# Patient Record
Sex: Female | Born: 1970 | Race: Black or African American | Hispanic: Yes | Marital: Single | Smoking: Never smoker
Health system: Southern US, Community
[De-identification: ages and names within clinical notes are randomized; demographics above are authoritative.]

## PROBLEM LIST (undated history)

## (undated) DIAGNOSIS — I10 Essential (primary) hypertension: Secondary | ICD-10-CM

## (undated) HISTORY — PX: ABDOMINAL HYSTERECTOMY: SHX81

---

## 2016-07-27 ENCOUNTER — Emergency Department
Admission: EM | Admit: 2016-07-27 | Discharge: 2016-07-28 | Disposition: A | Discharge status: Home / Self Care | Payer: Managed Care, Other (non HMO) | Attending: Emergency Medicine | Admitting: Emergency Medicine

## 2016-07-27 DIAGNOSIS — R51 Headache: Secondary | ICD-10-CM | POA: Diagnosis present

## 2016-07-27 DIAGNOSIS — I10 Essential (primary) hypertension: Secondary | ICD-10-CM | POA: Diagnosis not present

## 2016-07-27 DIAGNOSIS — R519 Headache, unspecified: Secondary | ICD-10-CM

## 2016-07-27 DIAGNOSIS — Z79899 Other long term (current) drug therapy: Secondary | ICD-10-CM | POA: Diagnosis not present

## 2016-07-27 HISTORY — DX: Essential (primary) hypertension: I10

## 2016-07-27 MED ORDER — KETOROLAC TROMETHAMINE 30 MG/ML IJ SOLN
30.0000 mg | Freq: Once | INTRAMUSCULAR | Status: AC
  Administered 2016-07-28: 30 mg via INTRAVENOUS
  Filled 2016-07-27: qty 1

## 2016-07-27 MED ORDER — METOCLOPRAMIDE HCL 5 MG/ML IJ SOLN
10.0000 mg | Freq: Once | INTRAMUSCULAR | Status: AC
Start: 2016-07-28 — End: 2016-07-28
  Administered 2016-07-28: 10 mg via INTRAVENOUS
  Filled 2016-07-27: qty 2

## 2016-07-27 MED ORDER — DIPHENHYDRAMINE HCL 50 MG/ML IJ SOLN
25.0000 mg | Freq: Once | INTRAMUSCULAR | Status: AC
  Administered 2016-07-28: 25 mg via INTRAVENOUS
  Filled 2016-07-27: qty 1

## 2016-07-27 MED ORDER — SODIUM CHLORIDE 0.9 % IV BOLUS (SEPSIS)
1000.0000 mL | Freq: Once | INTRAVENOUS | Status: AC
  Administered 2016-07-28: 1000 mL via INTRAVENOUS

## 2016-07-27 NOTE — ED Notes (Addendum)
MD notified about pt stating she has chest pain as well as the other symptoms. MD states to order Chest pain protocols.

## 2016-07-27 NOTE — ED Triage Notes (Signed)
Patient reports having headaches daily and that her blood pressure has been elevated.  Patient with history of hypertension and taking meds as prescribed.

## 2016-07-28 ENCOUNTER — Emergency Department: Payer: Managed Care, Other (non HMO)

## 2016-07-28 ENCOUNTER — Encounter: Payer: Self-pay | Admitting: Emergency Medicine

## 2016-07-28 LAB — CBC
HCT: 32.2 % — ABNORMAL LOW (ref 35.0–47.0)
Hemoglobin: 11.2 g/dL — ABNORMAL LOW (ref 12.0–16.0)
MCH: 31.8 pg (ref 26.0–34.0)
MCHC: 34.8 g/dL (ref 32.0–36.0)
MCV: 91.3 fL (ref 80.0–100.0)
PLATELETS: 208 10*3/uL (ref 150–440)
RBC: 3.53 MIL/uL — AB (ref 3.80–5.20)
RDW: 13.3 % (ref 11.5–14.5)
WBC: 6.7 10*3/uL (ref 3.6–11.0)

## 2016-07-28 LAB — BASIC METABOLIC PANEL
Anion gap: 7 (ref 5–15)
BUN: 15 mg/dL (ref 6–20)
CALCIUM: 9.1 mg/dL (ref 8.9–10.3)
CO2: 27 mmol/L (ref 22–32)
CREATININE: 0.48 mg/dL (ref 0.44–1.00)
Chloride: 103 mmol/L (ref 101–111)
GFR calc Af Amer: >60 mL/min (ref >60)
Glucose, Bld: 99 mg/dL (ref 65–99)
Potassium: 3.6 mmol/L (ref 3.5–5.1)
Sodium: 137 mmol/L (ref 135–145)

## 2016-07-28 LAB — TROPONIN I

## 2016-07-28 MED ORDER — BUTALBITAL-APAP-CAFFEINE 50-325-40 MG PO TABS
1.0000 | ORAL_TABLET | Freq: Four times a day (QID) | ORAL | 0 refills | Status: AC | PRN
Start: 2016-07-28 — End: 2017-07-28

## 2016-07-28 MED ORDER — BUTALBITAL-APAP-CAFFEINE 50-325-40 MG PO TABS
2.0000 | ORAL_TABLET | Freq: Once | ORAL | Status: AC
  Administered 2016-07-28: 2 via ORAL
  Filled 2016-07-28: qty 2

## 2016-07-28 NOTE — ED Provider Notes (Signed)
[facility] Emergency Department Provider Note   ____________________________________________   First MD Initiated Contact with Patient 07/27/16 2348     (approximate)  I have reviewed the triage vital signs and the nursing notes.   HISTORY  Chief Complaint Headache and Hypertension    HPI Sherri Boyd is a 46 y.o. female who comes into the hospital today with headache and elevated blood pressure. She reports that she's been having this for 2 days. The patient has had headaches before but she is unsure if it was always related to her blood pressure. She reports her blood pressure was 143/94 and 145/100 at its highest. The patient reports that she has been taking her blood pressure medicine without any difficulty. She tried some Goody powders, Advil and Tylenol but none of it seems to be helping. The patient reports her pain is 8 out of 10 in intensity currently. She reports that she was never officially diagnosed with migraines but again she has had problems with headaches in the past. The patient reports that her blood pressure normally is somewhere between 120 150/9200. The patient has had some nausea but denies light sensitivity. She's also had some chest pain and occasional shortness of breath but nothing at this time. The patient decided to come into the hospital for further evaluation of these symptoms.   Past Medical History:  Diagnosis Date  . Hypertension     There are no active problems to display for this patient.   Past Surgical History:  Procedure Laterality Date  . ABDOMINAL HYSTERECTOMY      Prior to Admission medications   Medication Sig Start Date End Date Taking? Authorizing Provider  BYSTOLIC 10 MG tablet Take 10 mg by mouth daily.   Yes Historical Provider, MD  escitalopram (LEXAPRO) 10 MG tablet Take 10 mg by mouth daily.   Yes Historical Provider, MD  butalbital-acetaminophen-caffeine (FIORICET, ESGIC) [phone] MG tablet Take  1-2 tablets by mouth every 6 (six) hours as needed for headache. 07/28/16 07/28/17  Rebecka ApleyAllison P Nyan Dufresne, MD    Allergies Patient has no known allergies.  No family history on file.  Social History Social History  Substance Use Topics  . Smoking status: Never Smoker  . Smokeless tobacco: Not on file  . Alcohol use Yes    Review of Systems Constitutional: high blood pressure Eyes: No visual changes. ENT: No sore throat. Cardiovascular: chest pain. Respiratory:  shortness of breath. Gastrointestinal: Nausea with No abdominal pain. no vomiting.  No diarrhea.  No constipation. Genitourinary: Negative for dysuria. Musculoskeletal: Negative for back pain. Skin: Negative for rash. Neurological: headache.  10-point ROS otherwise negative.  ____________________________________________   PHYSICAL EXAM:  VITAL SIGNS: ED Triage Vitals [07/27/16 2305]  Enc Vitals Group     BP (!) 145/100     Pulse Rate 64     Resp 20     Temp 98.5 F (36.9 C)     Temp Source Oral     SpO2 100 %     Weight 200 lb (90.7 kg)     Height 5\' 9"  (1.753 m)     Head Circumference      Peak Flow      Pain Score 8     Pain Loc      Pain Edu?      Excl. in GC?     Constitutional: Alert and oriented. Well appearing and in mild to moderate distress. Eyes: Conjunctivae are normal. PERRL. EOMI. Head: Atraumatic. Nose: No  congestion/rhinnorhea. Mouth/Throat: Mucous membranes are moist.  Oropharynx non-erythematous. Cardiovascular: Normal rate, regular rhythm. Grossly normal heart sounds.  Good peripheral circulation. Respiratory: Normal respiratory effort.  No retractions. Lungs CTAB. Gastrointestinal: Soft and nontender. No distention. Positive bowel sounds Musculoskeletal: No lower extremity tenderness nor edema.   Neurologic:  Normal speech and language. Cranial nerves II through XII are grossly intact with no focal motor or neuro deficits Skin:  Skin is warm, dry and intact.  Psychiatric: Mood and  affect are normal.   ____________________________________________   LABS (all labs ordered are listed, but only abnormal results are displayed)  Labs Reviewed  CBC - Abnormal; Notable for the following:       Result Value   RBC 3.53 (*)    Hemoglobin 11.2 (*)    HCT 32.2 (*)    All other components within normal limits  BASIC METABOLIC PANEL  TROPONIN I   ____________________________________________  EKG  ED ECG REPORT I, Rebecka Apley, the attending physician, personally viewed and interpreted this ECG.   Date: 07/27/2016  EKG Time: 2350  Rate: 58  Rhythm: normal sinus rhythm  Axis: normal  Intervals:none  ST&T Change: none  ____________________________________________  RADIOLOGY  CT head ____________________________________________   PROCEDURES  Procedure(s) performed: None  Procedures  Critical Care performed: No  ____________________________________________   INITIAL IMPRESSION / ASSESSMENT AND PLAN / ED COURSE  Pertinent labs & imaging results that were available during my care of the patient were reviewed by me and considered in my medical decision making (see chart for details).  This is a 46 year old female who comes into the hospital today with a headache and high blood pressure. The patient's blood pressure has some mild elevation but nothing severe. I did give the patient some Reglan, Benadryl Toradol and a liter of normal saline. After that she reports that her headache was improved. The patient's CT scan of her head is negative as well as her blood work and it chest x-ray. I did give the patient dose of Fioricet and I did encourage her to follow-up with her doctor for further evaluation of her blood pressure medicines as well as to follow-up with neurology. Otherwise the patient feels well and she'll be discharged home. The patient has no further questions or concerns at this time and she'll be discharged.  Clinical Course as of Jul 29 399   Wynelle Link Jul 28, 2016  0106 No acute intracranial abnormality. CT Head Wo Contrast [AW]  0107 No active cardiopulmonary disease. DG Chest 2 View [AW]    Clinical Course User Index [AW] Rebecka Apley, MD     ____________________________________________   FINAL CLINICAL IMPRESSION(S) / ED DIAGNOSES  Final diagnoses:  Hypertension, unspecified type  Acute nonintractable headache, unspecified headache type      NEW MEDICATIONS STARTED DURING THIS VISIT:  Discharge Medication List as of 07/28/2016  2:54 AM       Note:  This document was prepared using Dragon voice recognition software and may include unintentional dictation errors.    Rebecka Apley, MD 07/28/16 [phone]

## 2016-07-28 NOTE — ED Notes (Signed)
Patient transported to X-ray 

## 2016-07-28 NOTE — Discharge Instructions (Signed)
Please follow up with your primary care physician to have them re evaluate your BP medicine. Please return with any worsened symptoms

## 2016-07-28 NOTE — ED Notes (Signed)

## 2016-07-30 ENCOUNTER — Ambulatory Visit
Admission: RE | Admit: 2016-07-30 | Discharge: 2016-07-30 | Disposition: A | Discharge status: Home / Self Care | Payer: Managed Care, Other (non HMO) | Source: Ambulatory Visit | Attending: Family Medicine | Admitting: Family Medicine

## 2016-07-30 ENCOUNTER — Other Ambulatory Visit: Payer: Self-pay | Admitting: Family Medicine

## 2016-07-30 DIAGNOSIS — Z1231 Encounter for screening mammogram for malignant neoplasm of breast: Secondary | ICD-10-CM | POA: Insufficient documentation

## 2018-03-22 IMAGING — CT CT HEAD W/O CM
3 series · 15 of 47 positions shown, 18 images · non-contrast
Comparison: None.

CLINICAL DATA: Hypertension, headache.  No trauma.

EXAM:
CT HEAD WITHOUT CONTRAST
TECHNIQUE: Contiguous axial images were obtained from the base of the skull
through the vertex without intravenous contrast.

[Series 2: head wo · axial · 0.47mm/px · z∈[-150,-25]mm · 9 of 31 slices shown, 12 images]
[im 3/31  brain]
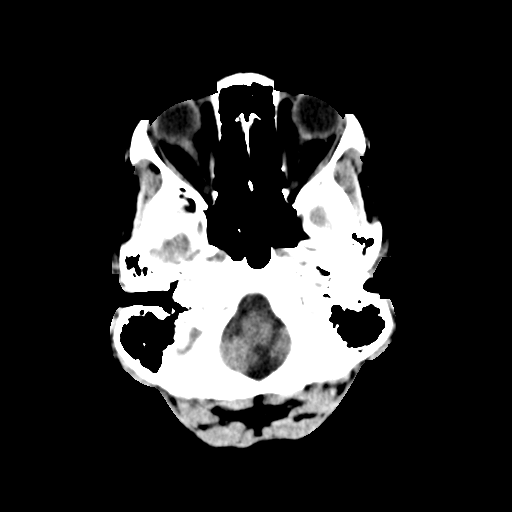
[im 3/31  bone]
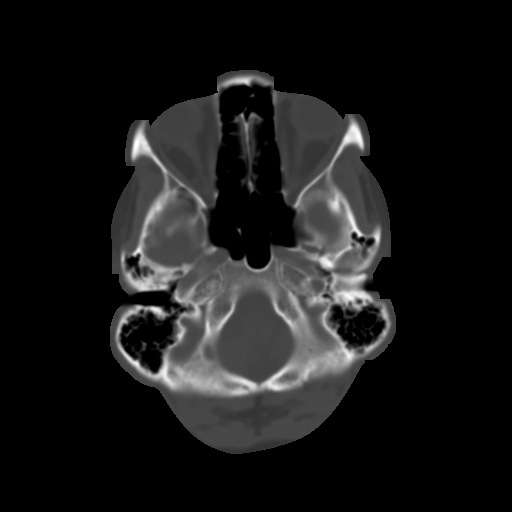
[im 6/31  brain]
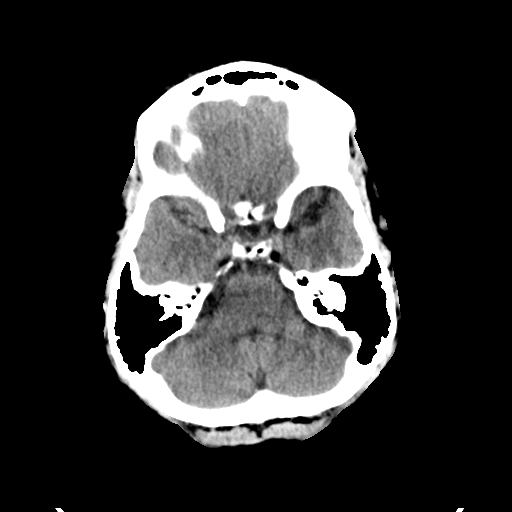
[im 9/31  brain]
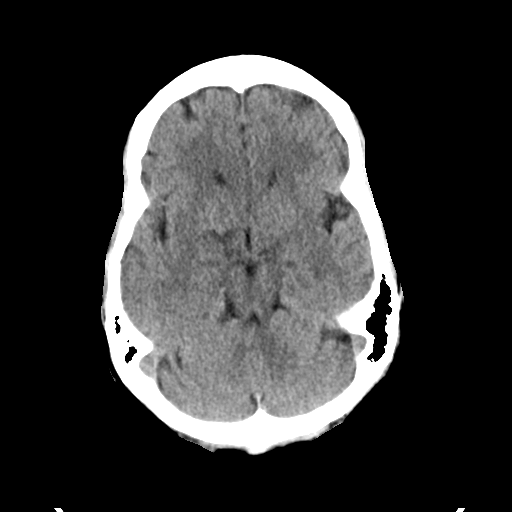
[im 12/31  brain]
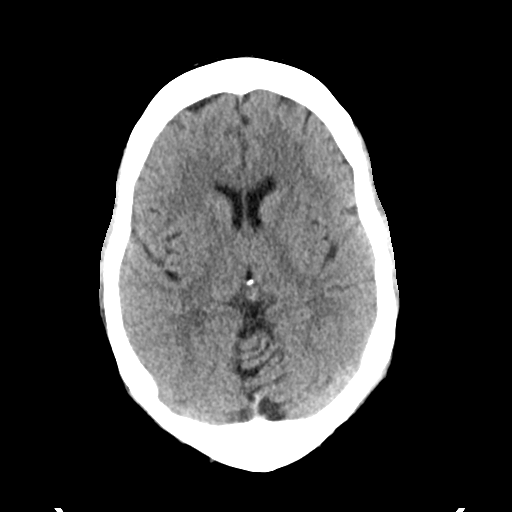
[im 16/31  brain]
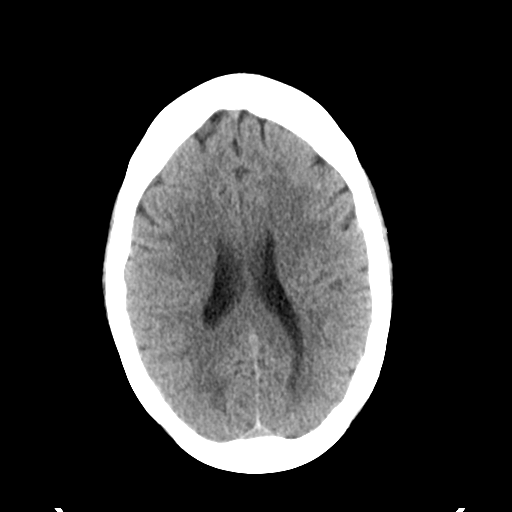
[im 16/31  bone]
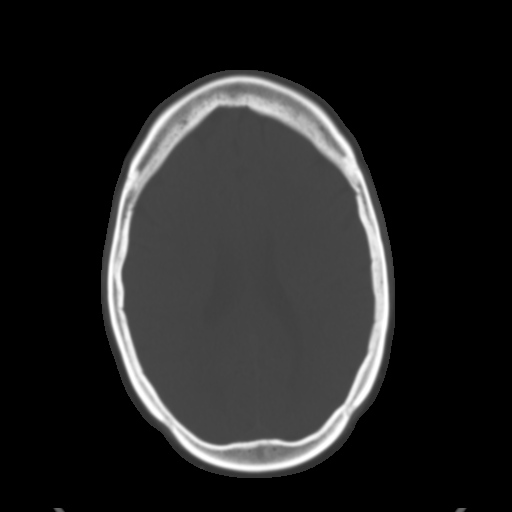
[im 19/31  brain]
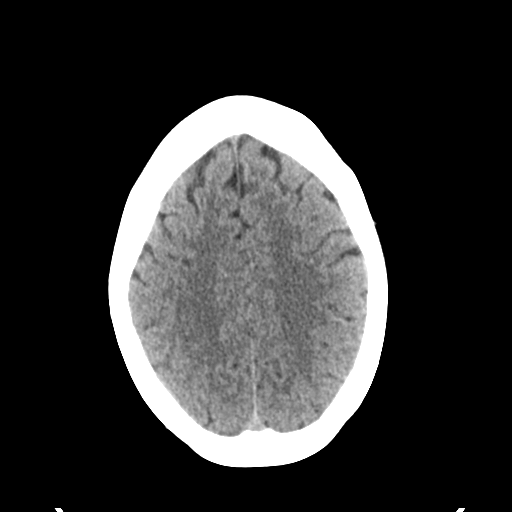
[im 22/31  brain]
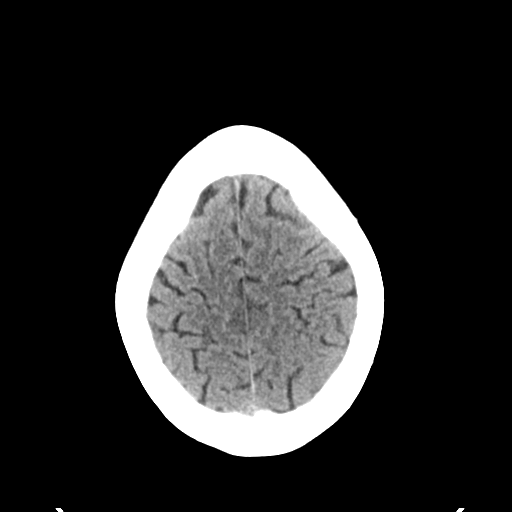
[im 25/31  brain]
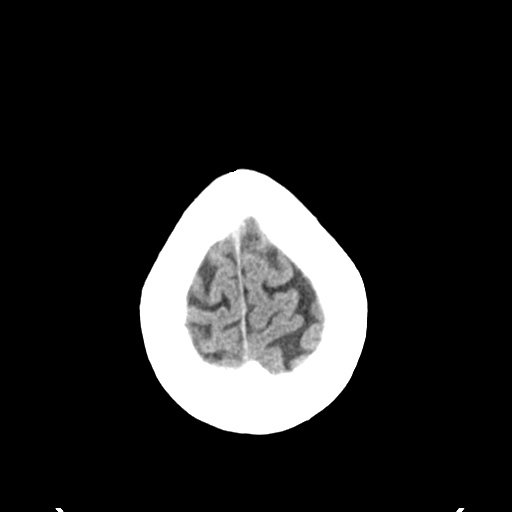
[im 28/31  brain]
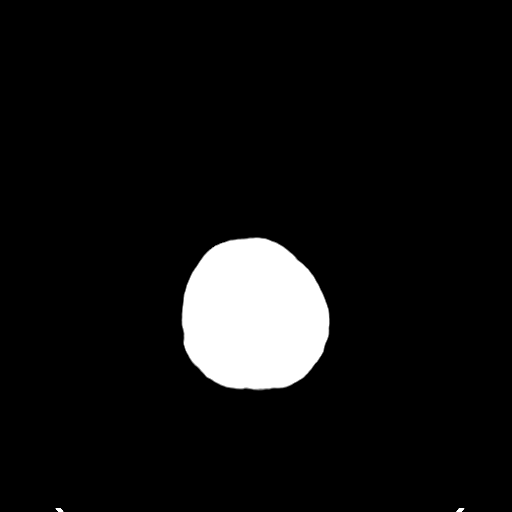
[im 28/31  bone]
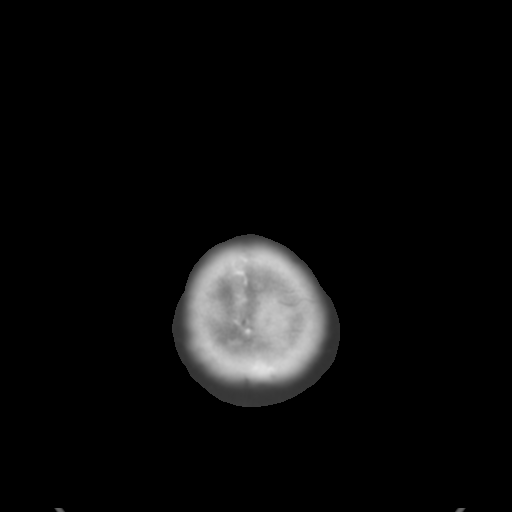

[Series 4: coronal soft tissue · coronal · 0.29mm/px · 3 of 67 slices shown]
[im 23/67  brain]
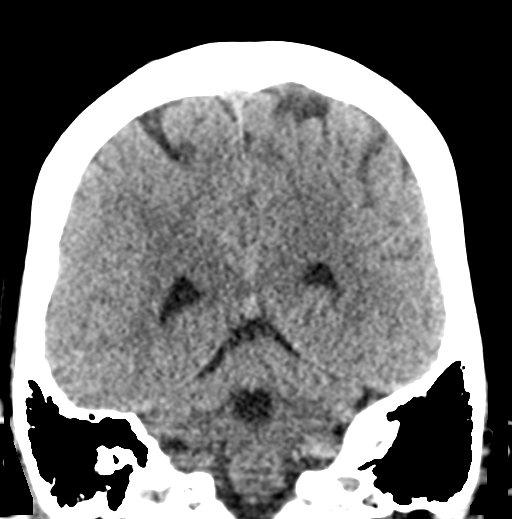
[im 30/67  brain]
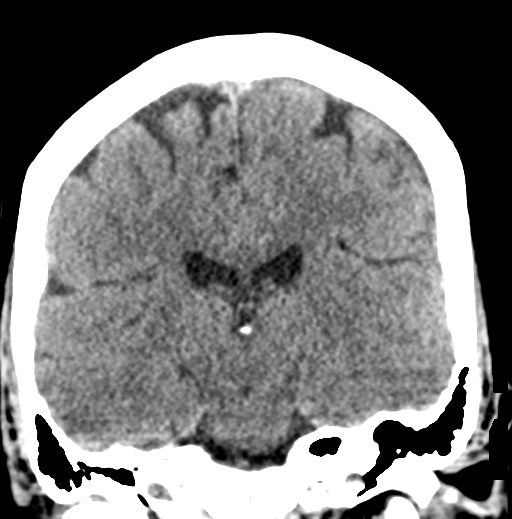
[im 37/67  brain]
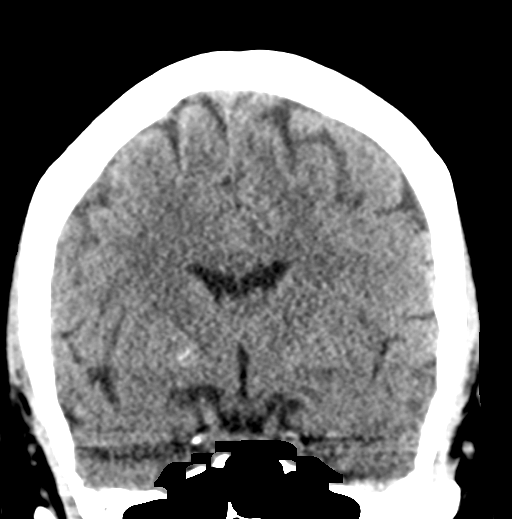

[Series 5: sagittal soft tissue · sagittal · 0.29mm/px · 3 of 49 slices shown]
[im 17/49  brain]
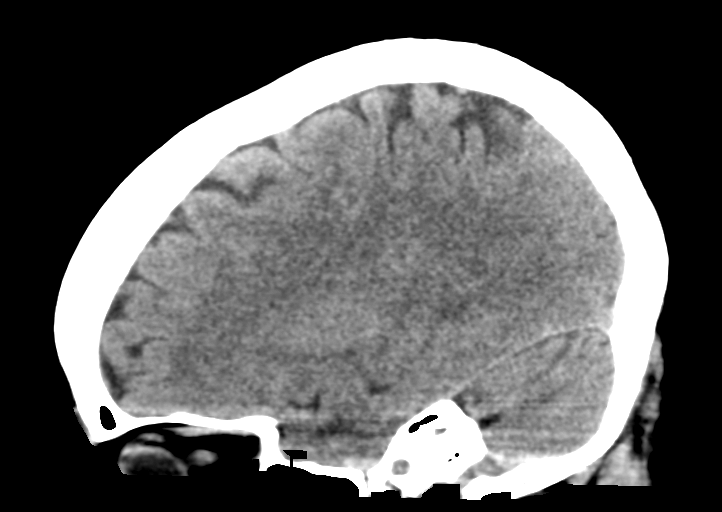
[im 25/49  brain]
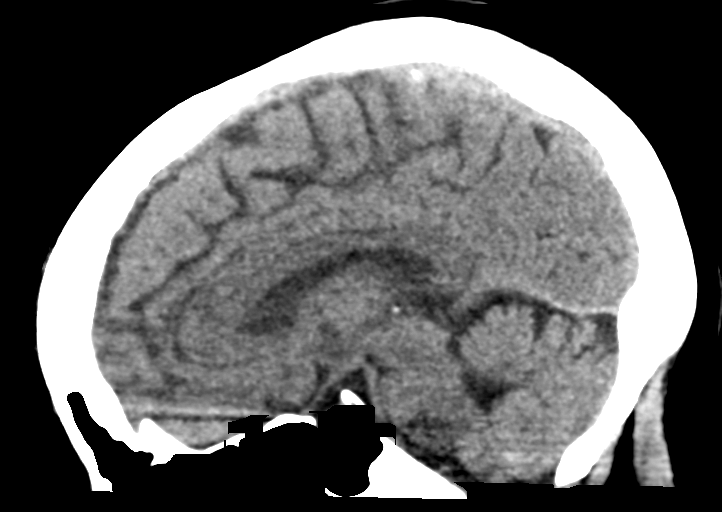
[im 33/49  brain]
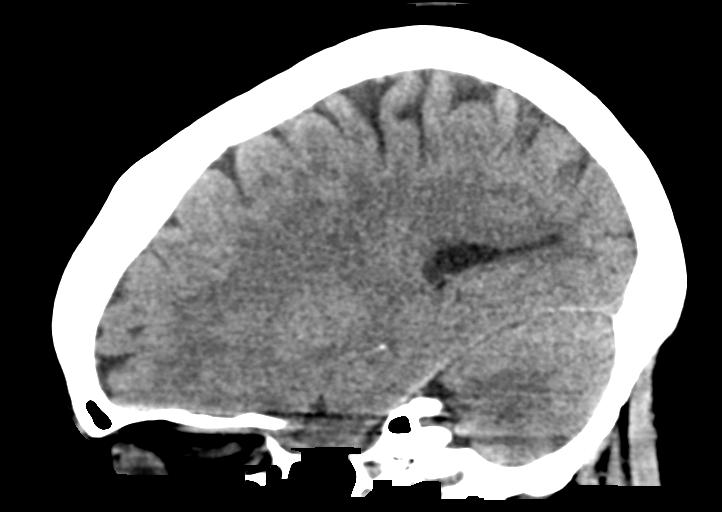

[15 of 47 positions shown; findings below may reference images not displayed]

FINDINGS: BRAIN: The ventricles and sulci are normal. No intraparenchymal
hemorrhage, mass effect nor midline shift. No acute large vascular
territory infarcts. Grey-white matter distinction is maintained. The
basal ganglia are unremarkable. No abnormal extra-axial fluid
collections. Idiopathic basal ganglial calcifications are noted
bilaterally. Basal cisterns are not effaced and midline. The
brainstem and cerebellar hemispheres are without acute
abnormalities.

VASCULAR: Unremarkable.

SKULL/SOFT TISSUES: No skull fracture. No significant soft tissue
swelling.

ORBITS/SINUSES: The included ocular globes and orbital contents are
normal.The mastoid air cells are clear. The included paranasal
sinuses are well-aerated.

OTHER: None.
IMPRESSION: No acute intracranial abnormality.

## 2018-09-11 ENCOUNTER — Ambulatory Visit
Admission: RE | Admit: 2018-09-11 | Discharge: 2018-09-11 | Disposition: A | Discharge status: Home / Self Care | Payer: BLUE CROSS/BLUE SHIELD | Attending: Family Medicine | Admitting: Family Medicine

## 2018-09-11 ENCOUNTER — Ambulatory Visit
Admission: RE | Admit: 2018-09-11 | Discharge: 2018-09-11 | Disposition: A | Discharge status: Home / Self Care | Payer: BLUE CROSS/BLUE SHIELD | Source: Ambulatory Visit | Attending: Family Medicine | Admitting: Family Medicine

## 2018-09-11 ENCOUNTER — Other Ambulatory Visit: Payer: Self-pay

## 2018-09-11 ENCOUNTER — Other Ambulatory Visit: Payer: Self-pay | Admitting: Family Medicine

## 2018-09-11 DIAGNOSIS — G8929 Other chronic pain: Secondary | ICD-10-CM | POA: Insufficient documentation

## 2018-09-11 DIAGNOSIS — M545 Low back pain: Principal | ICD-10-CM

## 2019-12-31 ENCOUNTER — Other Ambulatory Visit: Payer: Self-pay | Admitting: Family Medicine

## 2019-12-31 ENCOUNTER — Ambulatory Visit
Admission: RE | Admit: 2019-12-31 | Discharge: 2019-12-31 | Disposition: A | Discharge status: Home / Self Care | Payer: BLUE CROSS/BLUE SHIELD | Source: Ambulatory Visit | Attending: Family Medicine | Admitting: Family Medicine

## 2019-12-31 DIAGNOSIS — Z1231 Encounter for screening mammogram for malignant neoplasm of breast: Secondary | ICD-10-CM
